# Patient Record
Sex: Male | Born: 2000 | Race: White | Hispanic: No | Marital: Single | Smoking: Never smoker
Health system: Southern US, Community
[De-identification: ages and names within clinical notes are randomized; demographics above are authoritative.]

---
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
## 2020-05-19 ENCOUNTER — Ambulatory Visit: Payer: Self-pay

## 2020-05-19 ENCOUNTER — Other Ambulatory Visit: Payer: Self-pay

## 2020-05-19 ENCOUNTER — Other Ambulatory Visit: Payer: Self-pay | Admitting: Occupational Medicine

## 2020-05-19 DIAGNOSIS — Z0000 Encounter for general adult medical examination without abnormal findings: Secondary | ICD-10-CM

## 2022-06-12 IMAGING — DX DG CHEST 1V
1 series · 1 of 1 positions shown · non-contrast
Comparison: 10/03/2001 chest radiograph report.

CLINICAL DATA: Physical exam

EXAM:
CHEST  1 VIEW

[chest pa]
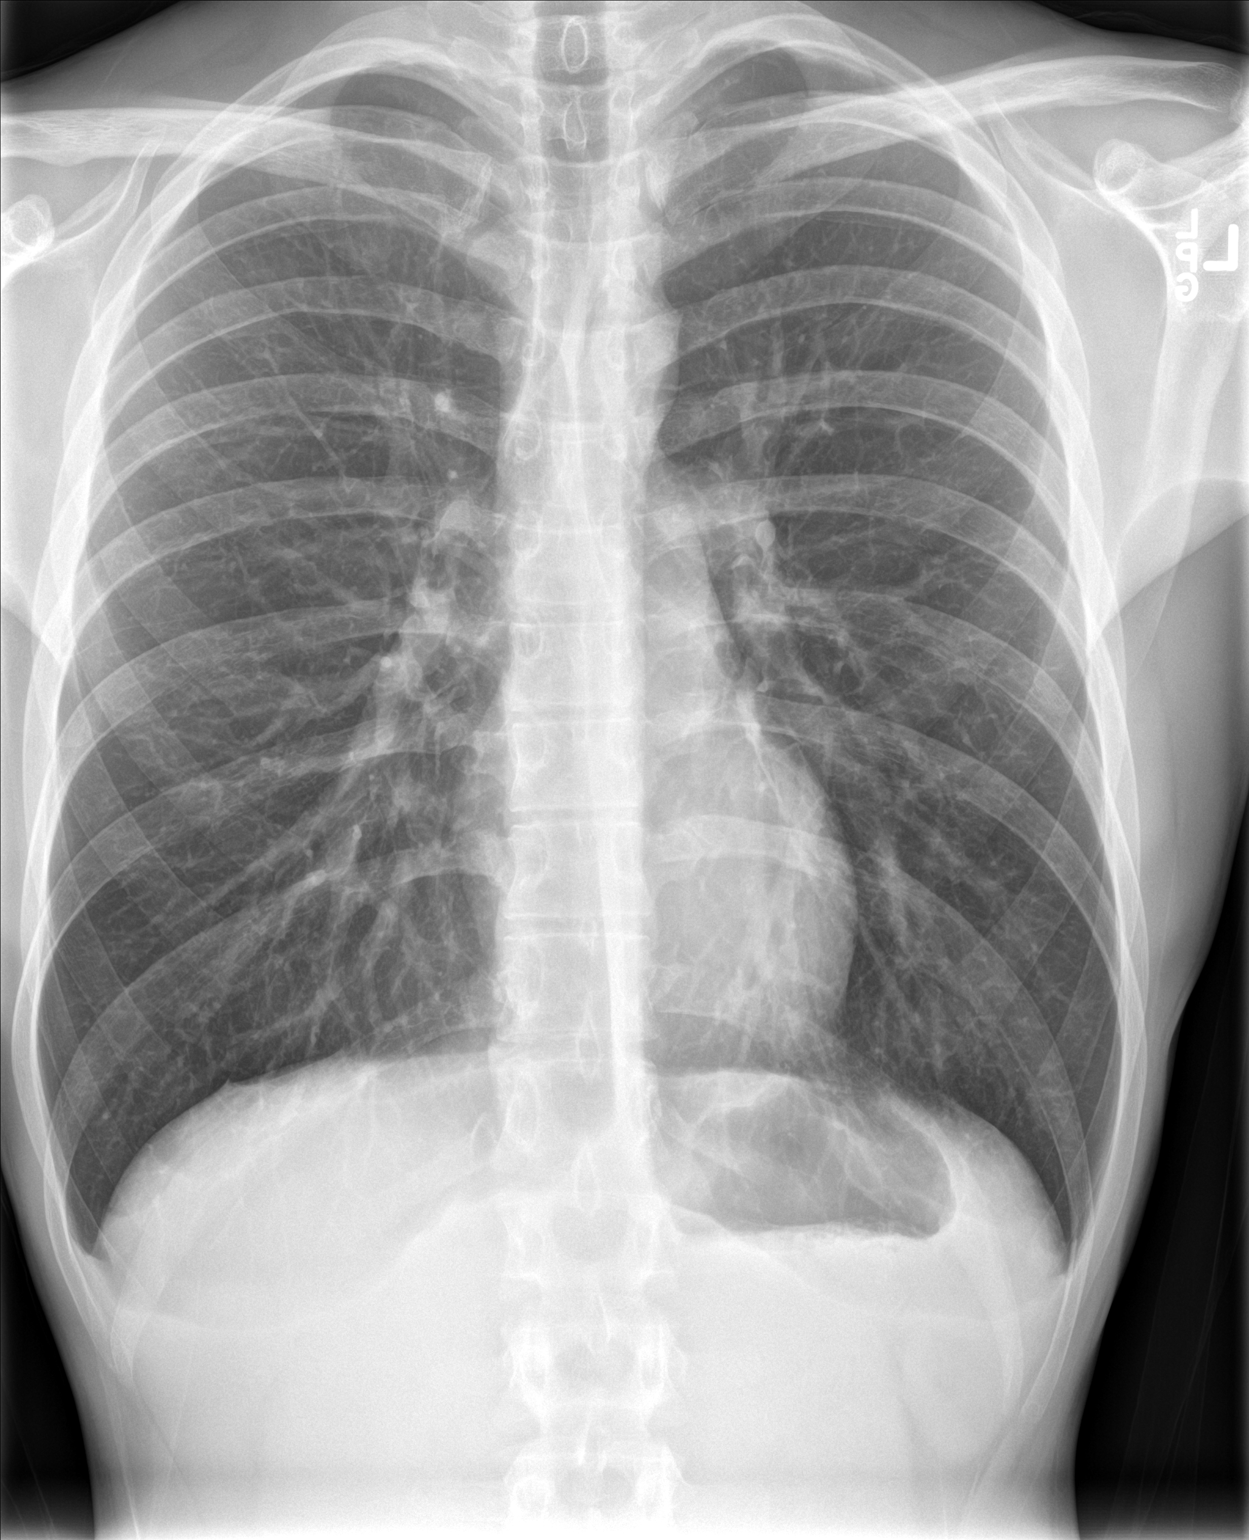

[1 of 1 positions shown; findings below may reference images not displayed]

FINDINGS: The heart size and mediastinal contours are within normal limits.
Both lungs are clear. The visualized skeletal structures are
unremarkable.
IMPRESSION: No focal airspace disease.

## 2022-07-19 ENCOUNTER — Ambulatory Visit: Payer: Self-pay

## 2022-07-19 ENCOUNTER — Other Ambulatory Visit: Payer: Self-pay | Admitting: Family Medicine

## 2022-07-19 DIAGNOSIS — Z0000 Encounter for general adult medical examination without abnormal findings: Secondary | ICD-10-CM
